# Patient Record
Sex: Male | Born: 1985 | Race: Black or African American | Hispanic: No | Marital: Married | Smoking: Current every day smoker
Health system: Southern US, Community
[De-identification: ages and names within clinical notes are randomized; demographics above are authoritative.]

## PROBLEM LIST (undated history)

## (undated) DIAGNOSIS — R9431 Abnormal electrocardiogram [ECG] [EKG]: Secondary | ICD-10-CM

## (undated) DIAGNOSIS — R079 Chest pain, unspecified: Secondary | ICD-10-CM

## (undated) DIAGNOSIS — J4540 Moderate persistent asthma, uncomplicated: Secondary | ICD-10-CM

## (undated) DIAGNOSIS — F17200 Nicotine dependence, unspecified, uncomplicated: Secondary | ICD-10-CM

## (undated) DIAGNOSIS — J45909 Unspecified asthma, uncomplicated: Secondary | ICD-10-CM

## (undated) DIAGNOSIS — J4520 Mild intermittent asthma, uncomplicated: Secondary | ICD-10-CM

## (undated) DIAGNOSIS — F17210 Nicotine dependence, cigarettes, uncomplicated: Secondary | ICD-10-CM

## (undated) HISTORY — DX: Mild intermittent asthma, uncomplicated: J45.20

## (undated) HISTORY — DX: Nicotine dependence, unspecified, uncomplicated: F17.200

## (undated) HISTORY — DX: Moderate persistent asthma, uncomplicated: J45.40

## (undated) HISTORY — DX: Abnormal electrocardiogram (ECG) (EKG): R94.31

## (undated) HISTORY — DX: Unspecified asthma, uncomplicated: J45.909

## (undated) HISTORY — DX: Chest pain, unspecified: R07.9

## (undated) HISTORY — DX: Nicotine dependence, cigarettes, uncomplicated: F17.210

---

## 2017-09-11 ENCOUNTER — Other Ambulatory Visit: Payer: Self-pay | Admitting: Family Medicine

## 2017-09-11 DIAGNOSIS — K439 Ventral hernia without obstruction or gangrene: Secondary | ICD-10-CM

## 2017-10-05 ENCOUNTER — Ambulatory Visit
Admission: RE | Admit: 2017-10-05 | Discharge: 2017-10-05 | Disposition: A | Discharge status: Home / Self Care | Payer: BLUE CROSS/BLUE SHIELD | Source: Ambulatory Visit | Attending: Family Medicine | Admitting: Family Medicine

## 2017-10-05 DIAGNOSIS — K439 Ventral hernia without obstruction or gangrene: Secondary | ICD-10-CM

## 2019-03-03 IMAGING — US US ABDOMEN LIMITED
1 series · 6 of 6 positions shown · non-contrast
Comparison: None.

CLINICAL DATA: 32-year-old male with right sided abdominal pain for
8 months. Question abdominal wall hernia.

EXAM:
ULTRASOUND ABDOMEN LIMITED

[Series 1: us abdomen limited · 0.17mm/px · 6 of 6 slices shown]
[im 1/6]
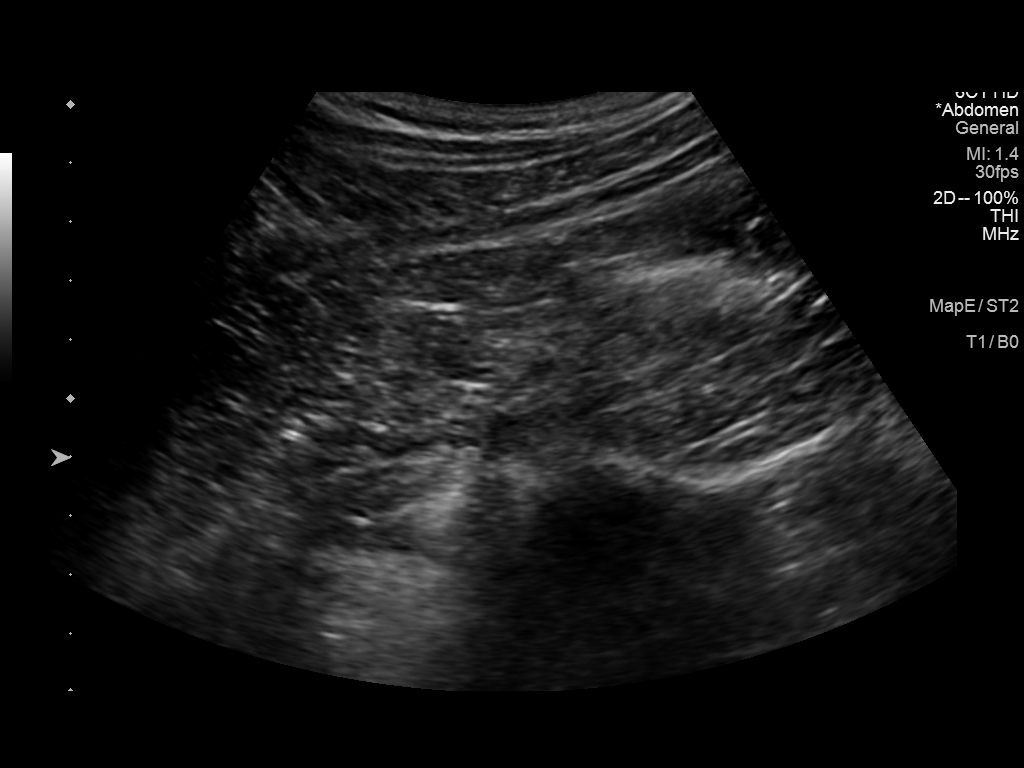
[im 2/6]
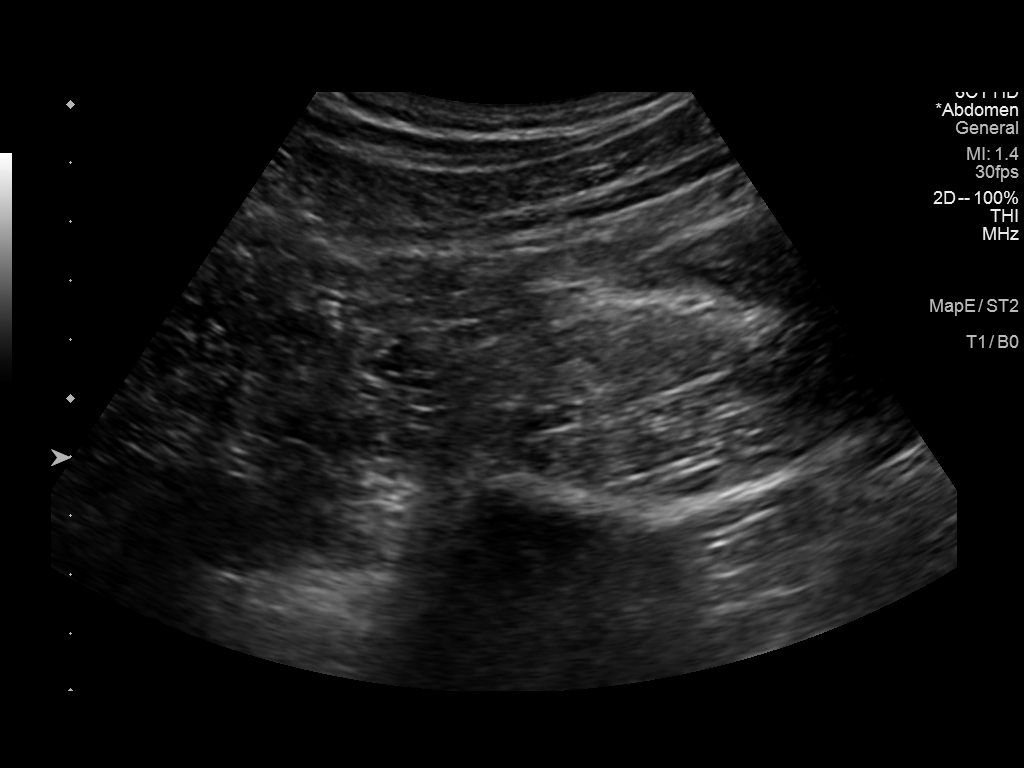
[im 3/6]
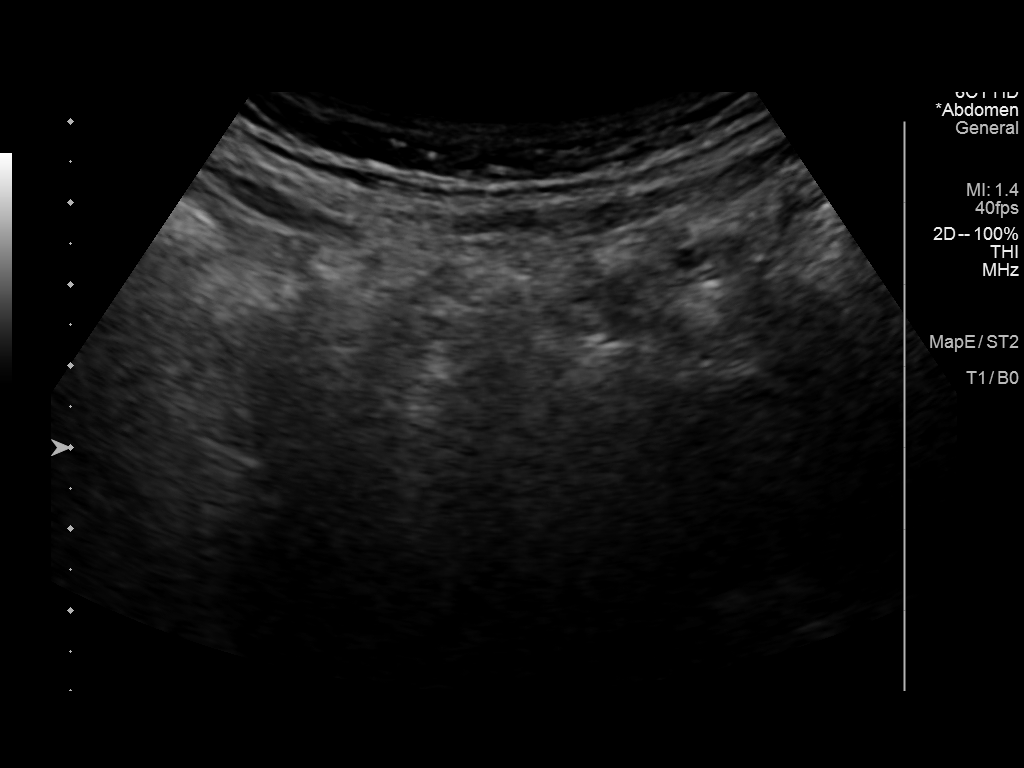
[im 4/6]
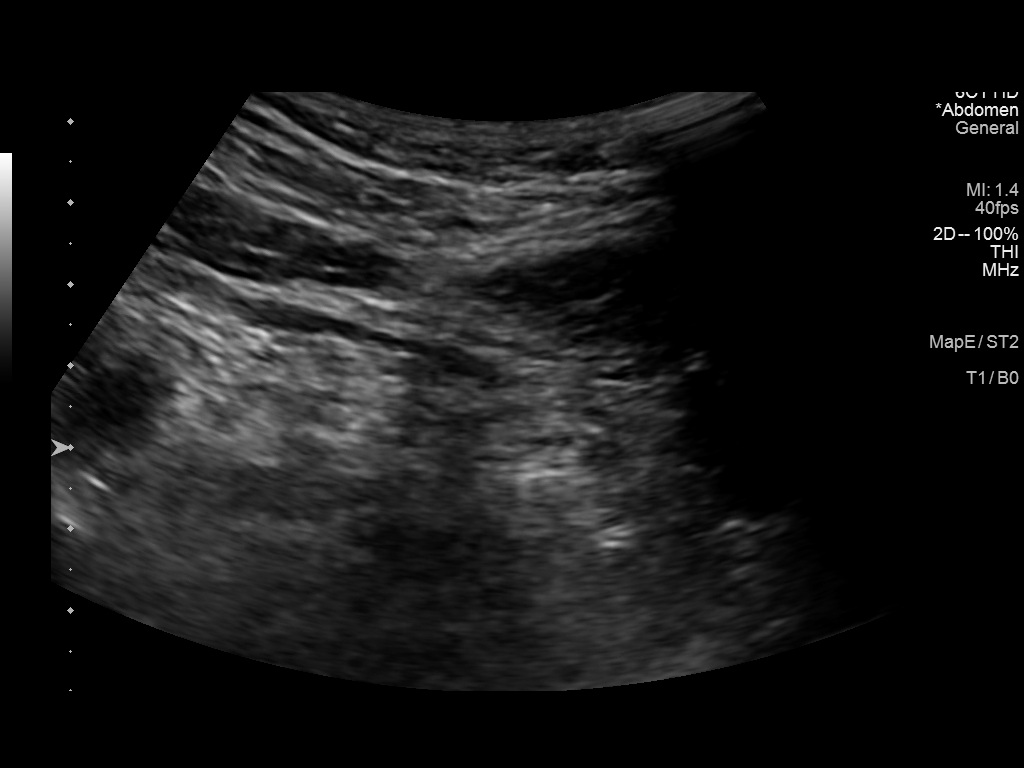
[im 5/6]
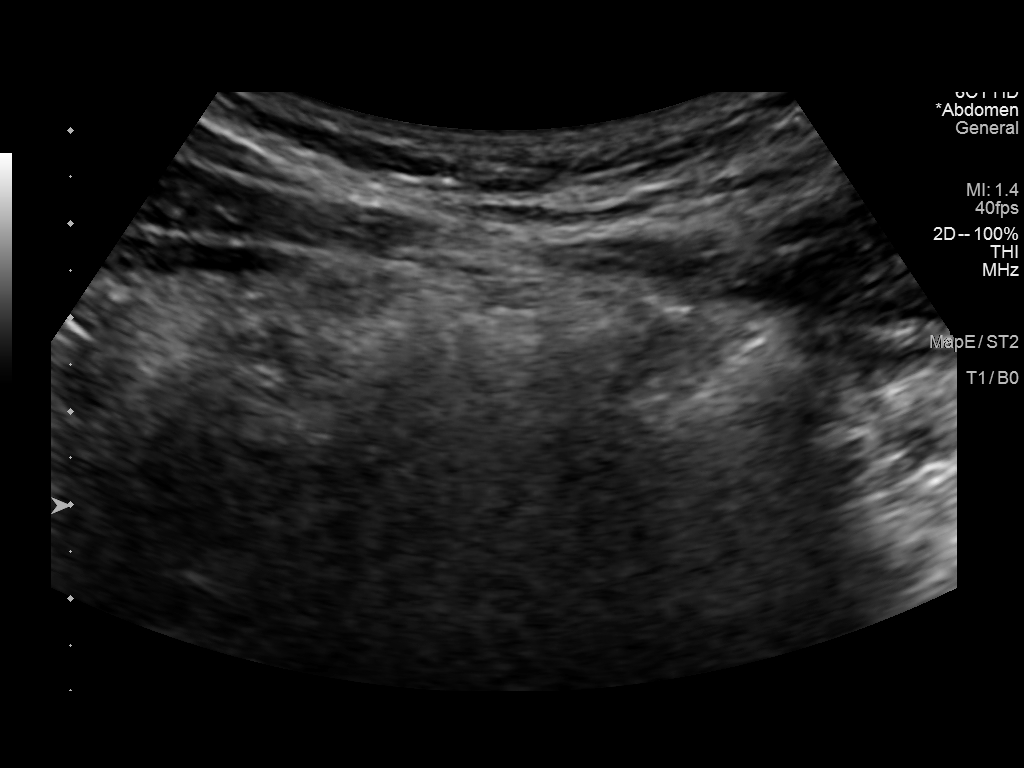
[im 6/6]
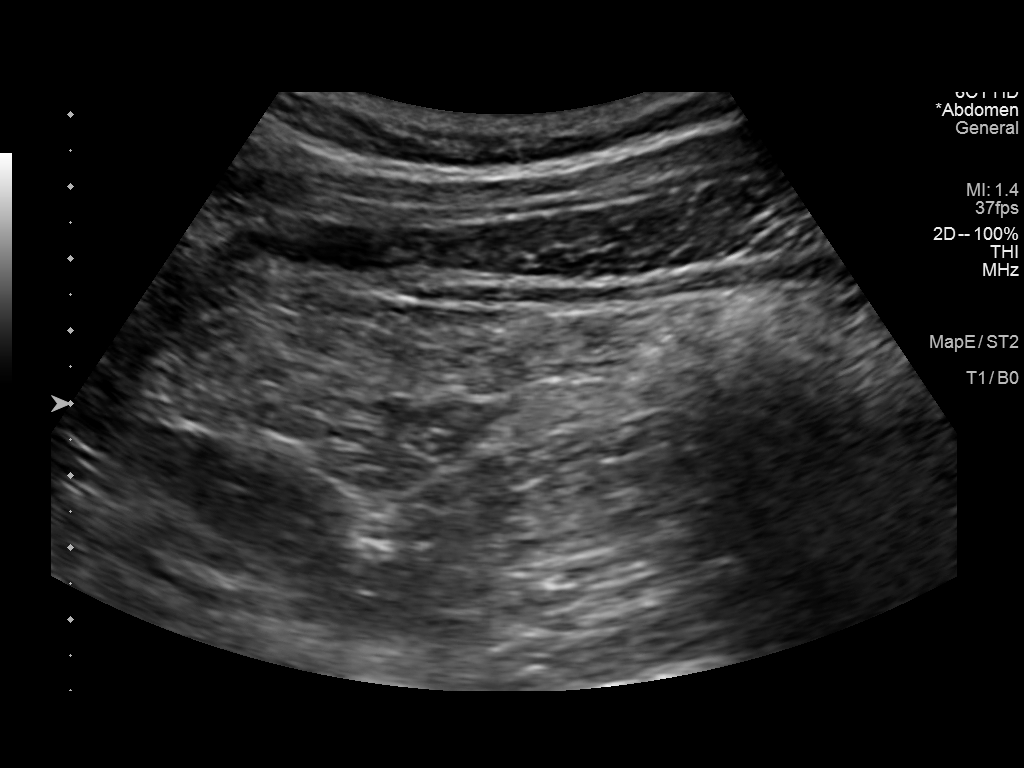

[6 of 6 positions shown; findings below may reference images not displayed]

FINDINGS: Interrogation of the right abdominal wall without evidence of hernia
or mass.
IMPRESSION: No ultrasound evidence of right abdominal wall hernia.

## 2021-02-22 DIAGNOSIS — Y92481 Parking lot as the place of occurrence of the external cause: Secondary | ICD-10-CM | POA: Diagnosis not present

## 2021-02-22 DIAGNOSIS — S99911A Unspecified injury of right ankle, initial encounter: Secondary | ICD-10-CM | POA: Diagnosis not present

## 2021-02-22 DIAGNOSIS — Z7722 Contact with and (suspected) exposure to environmental tobacco smoke (acute) (chronic): Secondary | ICD-10-CM | POA: Diagnosis not present

## 2021-02-22 DIAGNOSIS — S93401A Sprain of unspecified ligament of right ankle, initial encounter: Secondary | ICD-10-CM | POA: Diagnosis not present

## 2021-02-22 DIAGNOSIS — X501XXA Overexertion from prolonged static or awkward postures, initial encounter: Secondary | ICD-10-CM | POA: Diagnosis not present

## 2021-11-20 DIAGNOSIS — A6001 Herpesviral infection of penis: Secondary | ICD-10-CM | POA: Diagnosis not present

## 2021-11-20 DIAGNOSIS — Z113 Encounter for screening for infections with a predominantly sexual mode of transmission: Secondary | ICD-10-CM | POA: Diagnosis not present

## 2021-11-20 DIAGNOSIS — Z114 Encounter for screening for human immunodeficiency virus [HIV]: Secondary | ICD-10-CM | POA: Diagnosis not present

## 2021-11-28 ENCOUNTER — Telehealth: Payer: Self-pay

## 2021-11-28 NOTE — Progress Notes (Signed)
?Cardiology Office Note:   ? ?Date:  11/29/2021  ? ?ID:  Larry Fisher, DOB July 09, 1986, MRN [id number] ? ?PCP:  Filomena Jungling, NP  ? ?[facility] ?Cardiologist:  Alverda Skeans, MD ?Referring MD: Moshe Cipro, NP  ? ?Chief Complaint/Reason for Referral: Chest pain ? ?ASSESSMENT:   ? ?1. Precordial pain   ?2. Asthma, unspecified asthma severity, unspecified whether complicated, unspecified whether persistent   ?3. Tobacco abuse   ? ? ?PLAN:   ? ?In order of problems listed above: ?1.  Chest pain:  We will obtain a coronary CTA and echocardiogram to evaluate further.  If the patient has mild obstructive coronary artery disease, they will require a statin (with goal LDL < 70) and aspirin, if they have high-grade disease we will need to consider optimal medical therapy and if symptoms are refractory to medical therapy, then a cardiac catheterization with possible PCI will be pursued to alleviate symptoms.  If they have high risk disease we will proceed directly to cardiac catheterization.  I will keep follow-up open-ended depending on these results. ?2.  Asthma: This is being managed by other providers. ?3.  Tobacco abuse: I counseled the patient on the need to abstain from tobacco. ? ?     ? ?   ? ?Dispo:  Return if symptoms worsen or fail to improve.  ? ?  ? ?Medication Adjustments/Labs and Tests Ordered: ?Current medicines are reviewed at length with the patient today.  Concerns regarding medicines are outlined above. ? ?The following changes have been made:    ? ?Labs/tests ordered: ?Orders Placed This Encounter  ?Procedures  ? CT CORONARY MORPH W/CTA COR W/SCORE W/CA W/CM &/OR WO/CM  ? EKG 12-Lead  ? ECHOCARDIOGRAM COMPLETE  ? ? ?Medication Changes: ?Meds ordered this encounter  ?Medications  ? metoprolol tartrate (LOPRESSOR) 100 MG tablet  ?  Sig: Take 1 tablet (100 mg total) by mouth once for 1 dose. Take 90-120 minutes prior to scan.  ?  Dispense:  1 tablet  ?  Refill:  0  ? ? ? ?Current  medicines are reviewed at length with the patient today.  The patient does not have concerns regarding medicines. ? ? ?History of Present Illness:   ? ?FOCUSED PROBLEM LIST:   ?1.  Asthma ?2.  Tobacco abuse ? ?The patient is a 36 y.o. male with the indicated medical history here for recommendations regarding chest pain.  Patient tells me that he has developed chest pain on occasion.  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  Does not seem to occur reliably with exertion but mostly with rest.  On occasion it has happened with exertion.  He denies any shortness of breath.  He does smoke on a regular basis and is planning on quitting.  Denies any other significant cardiovascular symptomatology including palpitations, paroxysmal nocturnal dyspnea, orthopnea.  He has not required emergency room visits or hospitalizations. ? ?Current Medications: ?Current Meds  ?Medication Sig  ? albuterol (VENTOLIN HFA) 108 (90 Base) MCG/ACT inhaler Inhale into the lungs every 6 (six) hours as needed for wheezing or shortness of breath.  ? Albuterol Sulfate 2.5 MG/0.5ML NEBU Inhale into the lungs.  ? metoprolol tartrate (LOPRESSOR) 100 MG tablet Take 1 tablet (100 mg total) by mouth once for 1 dose. Take 90-120 minutes prior to scan.  ?  ? ?Allergies:    ?Patient has no known  allergies.  ? ?Social History:   ?Social History  ? ?Tobacco Use  ? Smoking status: Every Day  ?  Types: Cigarettes  ? Smokeless tobacco: Never  ?  ? ?Family Hx: ?Family History  ?Problem Relation Age of Onset  ? Cerebral aneurysm Mother   ? Asthma Father   ?  ? ?Review of Systems:   ?Please see the history of present illness.    ?All other systems reviewed and are negative. ?  ? ? ?EKGs/Labs/Other Test Reviewed:   ? ?EKG:  EKG performed Nov 27, 2021 that I personally reviewed demonstrates sinus rhythm; EKG today demonstrates normal sinus rhythm ? ?Prior CV  studies: ?None available ? ?Other studies Reviewed: ?Review of the additional studies/records demonstrates: Ultrasound abdomen without aortic pathology 2019 ? ?Recent Labs: ?No results found for requested labs within last 8760 hours.  ? ?Recent Lipid Panel ?No results found for: CHOL, TRIG, HDL, LDLCALC, LDLDIRECT ? ?Risk Assessment/Calculations:   ? ?  ?    ? ?Physical Exam:   ? ?VS:  BP 128/76   Pulse 81   Ht 5\' 8"  (1.727 m)   Wt 153 lb (69.4 kg)   SpO2 97%   BMI 23.26 kg/m?    ?Wt Readings from Last 3 Encounters:  ?11/29/21 153 lb (69.4 kg)  ?  ?GENERAL:  No apparent distress, AOx3 ?HEENT:  No carotid bruits, +2 carotid impulses, no scleral icterus ?CAR: RRR  no murmurs, gallops, rubs, or thrills ?RES:  Clear to auscultation bilaterally ?ABD:  Soft, nontender, nondistended, positive bowel sounds x 4 ?VASC:  +2 radial pulses, +2 carotid pulses, palpable pedal pulses ?NEURO:  CN 2-12 grossly intact; motor and sensory grossly intact ?PSYCH:  No active depression or anxiety ?EXT:  No edema, ecchymosis, or cyanosis ? ?Signed, ?01/29/22, MD  ?11/29/2021 5:16 PM    ?[facility] ?[address] [place], [place], [place]  [place] ?Phone: [phone]; Fax: [phone]  ? ?Note:  This document was prepared using Dragon voice recognition software and may include unintentional dictation errors. ?

## 2021-11-28 NOTE — Telephone Encounter (Signed)
NOTES SCANNED TO REFERRAL 

## 2021-11-29 ENCOUNTER — Ambulatory Visit (INDEPENDENT_AMBULATORY_CARE_PROVIDER_SITE_OTHER): Payer: BC Managed Care – PPO | Admitting: Internal Medicine

## 2021-11-29 ENCOUNTER — Encounter: Payer: Self-pay | Admitting: Internal Medicine

## 2021-11-29 VITALS — BP 128/76 | HR 81 | Ht 68.0 in | Wt 153.0 lb

## 2021-11-29 DIAGNOSIS — Z720 Tobacco use: Secondary | ICD-10-CM | POA: Diagnosis not present

## 2021-11-29 DIAGNOSIS — R072 Precordial pain: Secondary | ICD-10-CM

## 2021-11-29 DIAGNOSIS — J45909 Unspecified asthma, uncomplicated: Secondary | ICD-10-CM

## 2021-11-29 MED ORDER — METOPROLOL TARTRATE 100 MG PO TABS: 100.0000 mg | ORAL_TABLET | Freq: Once | ORAL | 0 refills | Status: DC

## 2021-11-29 NOTE — Patient Instructions (Signed)
Medication Instructions:  ?No changes ?*If you need a refill on your cardiac medications before your next appointment, please call your pharmacy* ? ? ?Lab Work: ?none ?If you have labs (blood work) drawn today and your tests are completely normal, you will receive your results only by: ?MyChart Message (if you have MyChart) OR ?A paper copy in the mail ?If you have any lab test that is abnormal or we need to change your treatment, we will call you to review the results. ? ? ?Testing/Procedures: ?Your physician has requested that you have an echocardiogram. Echocardiography is a painless test that uses sound waves to create images of your heart. It provides your doctor with information about the size and shape of your heart and how well your heart?s chambers and valves are working. This procedure takes approximately one hour. There are no restrictions for this procedure. ? ?Cardiac CTA - see instructions below. ? ? ?Follow-Up: ?As needed ? ? ?Other Instructions ? ? ?Your cardiac CT will be scheduled at  ?[facility] ?[address] ?[place], [place] [address] ?[phone] ? ?Please arrive at the [facility] and Children[facility] Entrance (Entrance C2) of [facility] 30 minutes prior to test start time. ?You can use the FREE valet parking offered at entrance C (encouraged to control the heart rate for the test)  ?Proceed to the [facility] Radiology Department (first floor) to check-in and test prep. ? ?All radiology patients and guests should use entrance C2 at [facility], accessed from [facility], even though the hospital's physical address listed is [address] ? ? ? ?If scheduled at [facility], please arrive 15 mins early for check-in and test prep. ? ?Please follow these instructions carefully (unless otherwise directed): ? ?Hold all erectile dysfunction medications at least 3 days (72 hrs) prior to test. ? ?On the Night Before the Test: ?Be  sure to Drink plenty of water. ?Do not consume any caffeinated/decaffeinated beverages or chocolate 12 hours prior to your test. ?Do not take any antihistamines 12 hours prior to your test. ? ?On the Day of the Test: ?Drink plenty of water until 1 hour prior to the test. ?Do not eat any food 4 hours prior to the test. ?You may take your regular medications prior to the test.  ?Take metoprolol (Lopressor) two hours prior to test. ? ?After the Test: ?Drink plenty of water. ?After receiving IV contrast, you may experience a mild flushed feeling. This is normal. ?On occasion, you may experience a mild rash up to 24 hours after the test. This is not dangerous. If this occurs, you can take Benadryl 25 mg and increase your fluid intake. ?If you experience trouble breathing, this can be serious. If it is severe call 911 IMMEDIATELY. If it is mild, please call our office. ?If you take any of these medications: Glipizide/Metformin, Avandament, Glucavance, please do not take 48 hours after completing test unless otherwise instructed. ? ?We will call to schedule your test 2-4 weeks out understanding that some insurance companies will need an authorization prior to the service being performed.  ? ?For non-scheduling related questions, please contact the cardiac imaging nurse navigator should you have any questions/concerns: ?Rockwell Alexandria, Cardiac Imaging Nurse Navigator ?Larey Brick, Cardiac Imaging Nurse Navigator ?[facility] ?Direct Office Dial: [phone]  ? ?For scheduling needs, including cancellations and rescheduling, please call [place], [phone]. ? ? ?Important Information About Sugar ? ? ? ? ?  ?

## 2021-12-18 ENCOUNTER — Ambulatory Visit (HOSPITAL_COMMUNITY): Payer: BC Managed Care – PPO | Attending: Cardiology

## 2021-12-18 ENCOUNTER — Telehealth (HOSPITAL_COMMUNITY): Payer: Self-pay | Admitting: Emergency Medicine

## 2021-12-18 NOTE — Telephone Encounter (Signed)
Reaching out to patient to offer assistance regarding upcoming cardiac imaging study; pt verbalizes understanding of appt date/time, parking situation and where to check in, pre-test NPO status and medications ordered, and verified current allergies; name and call back number provided for further questions should they arise ?Ege Muckey RN Navigator Cardiac Imaging ?[facility] ?[phone] office ?[phone] cell ? ?Denies iv issues ?100mg metoprolol tartrate  ?Arrival 400 ?

## 2021-12-19 ENCOUNTER — Other Ambulatory Visit (HOSPITAL_COMMUNITY): Payer: Self-pay | Admitting: *Deleted

## 2021-12-19 ENCOUNTER — Telehealth (HOSPITAL_COMMUNITY): Payer: Self-pay | Admitting: *Deleted

## 2021-12-19 MED ORDER — METOPROLOL TARTRATE 100 MG PO TABS: 100.0000 mg | ORAL_TABLET | Freq: Once | ORAL | 0 refills | Status: AC

## 2021-12-19 NOTE — Telephone Encounter (Signed)
Patient calling to request another metoprolol for his CCTA since he took the first pill on accident.  Sent another metoprolol tablet in.  Larey Brick RN Navigator Cardiac Imaging [facility] [phone] Office [phone] Cell

## 2021-12-20 ENCOUNTER — Ambulatory Visit (HOSPITAL_COMMUNITY)
Admission: RE | Admit: 2021-12-20 | Discharge: 2021-12-20 | Disposition: A | Discharge status: Home / Self Care | Payer: BC Managed Care – PPO | Source: Ambulatory Visit | Attending: Internal Medicine | Admitting: Internal Medicine

## 2021-12-20 DIAGNOSIS — R072 Precordial pain: Secondary | ICD-10-CM | POA: Diagnosis not present

## 2021-12-20 MED ORDER — NITROGLYCERIN 0.4 MG SL SUBL
0.8000 mg | SUBLINGUAL_TABLET | Freq: Once | SUBLINGUAL | Status: AC
  Administered 2021-12-20: 0.8 mg via SUBLINGUAL

## 2021-12-20 MED ORDER — IOHEXOL 350 MG/ML SOLN
100.0000 mL | Freq: Once | INTRAVENOUS | Status: AC | PRN
  Administered 2021-12-20: 100 mL via INTRAVENOUS

## 2021-12-20 MED ORDER — NITROGLYCERIN 0.4 MG SL SUBL
SUBLINGUAL_TABLET | SUBLINGUAL | Status: AC
  Filled 2021-12-20: qty 2

## 2021-12-20 MED ORDER — SODIUM CHLORIDE 0.9 % IV BOLUS
250.0000 mL | Freq: Once | INTRAVENOUS | Status: AC
  Administered 2021-12-20: 250 mL via INTRAVENOUS

## 2022-01-01 ENCOUNTER — Telehealth: Payer: Self-pay | Admitting: Internal Medicine

## 2022-01-01 NOTE — Telephone Encounter (Signed)
Pt c/o medication issue:  1. Name of Medication:   metoprolol tartrate (LOPRESSOR) 100 MG tablet (Expired)    2. How are you currently taking this medication (dosage and times per day)?  Take 1 tablet (100 mg total) by mouth once for 1 dose. Take 90-120 minutes prior to scan.      3. Are you having a reaction (difficulty breathing--STAT)? No  4. What is your medication issue?  Pt states that he is not sure whether to take medication before is ECHO appt or not. Please advise

## 2022-01-01 NOTE — Telephone Encounter (Signed)
Called patient and informed the metoprolol was for the CT scan only and that there are no restrictions for the Echo.  Pt voices understanding.

## 2022-01-03 ENCOUNTER — Ambulatory Visit (HOSPITAL_COMMUNITY): Payer: BC Managed Care – PPO | Attending: Cardiology

## 2022-01-03 DIAGNOSIS — R072 Precordial pain: Secondary | ICD-10-CM | POA: Insufficient documentation

## 2022-01-03 LAB — ECHOCARDIOGRAM COMPLETE
Area-P 1/2: 4.17 cm2
S' Lateral: 2.6 cm

## 2023-05-18 IMAGING — CT CT HEART MORP W/ CTA COR W/ SCORE W/ CA W/CM &/OR W/O CM
4 of 7 series · 8 of 20 positions shown, 9 images · IV contrast (APPLIED)
Comparison: None.

Addendum:
CLINICAL DATA: Chest pain

EXAM:
Cardiac CTA
MEDICATIONS:
Sub lingual nitro. 4mg and lopressor 100mg
TECHNIQUE: The patient was scanned on a Siemens Force [REDACTED]ice scanner. Gantry
rotation speed was 250 msecs. Collimation was .6 mm. A 100 kV
prospective scan was triggered in the ascending thoracic aorta at
140 HU's Full mA was used between 35% and 75% of the R-R interval.
Average HR during the scan was 52 bpm. The 3D data set was
interpreted on a dedicated work station using MPR, MIP and VRT
modes. A total of 80 cc of contrast was used.

[Series 7: ts diast sharp · axial · 0.30mm/px · z∈[-179,-144]mm · 2 of 261 slices shown]
[im 87/261  lung]
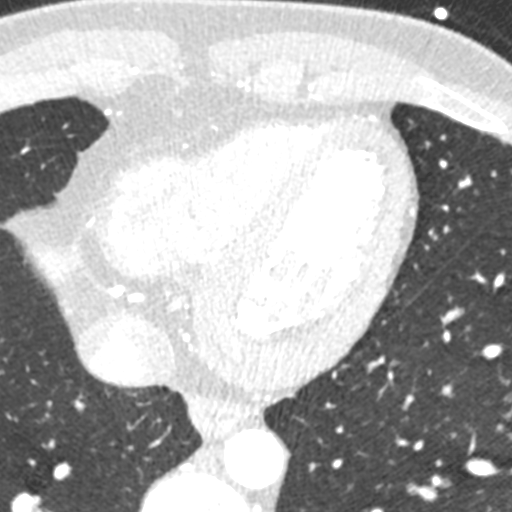
[im 174/261  lung]
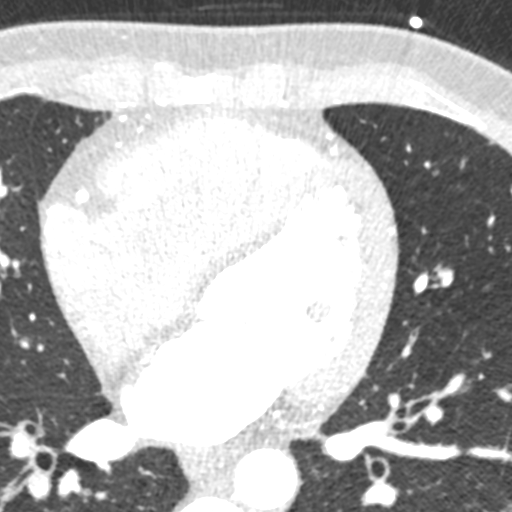

[Series 8: ts syst sharp · axial · 0.30mm/px · z∈[-179,-144]mm · 2 of 261 slices shown]
[im 87/261  lung]
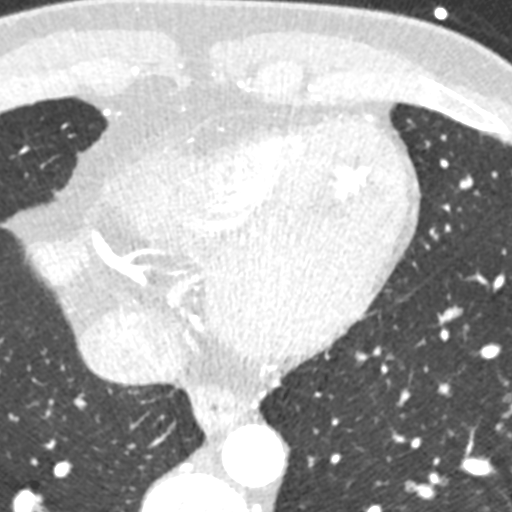
[im 174/261  lung]
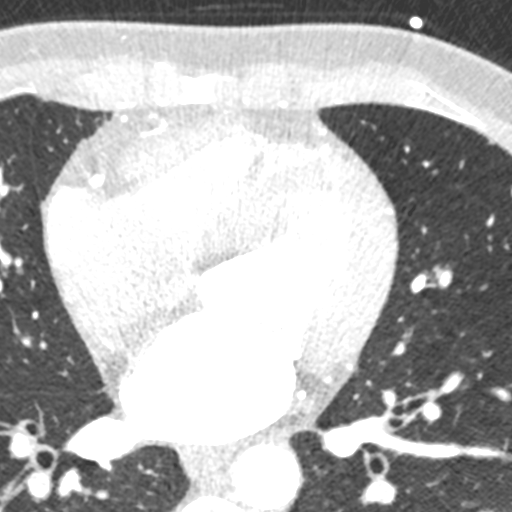

[Series 9: best syst · axial · 0.30mm/px · z∈[-179,-144]mm · 2 of 261 slices shown, 3 images]
[im 87/261  vessel]
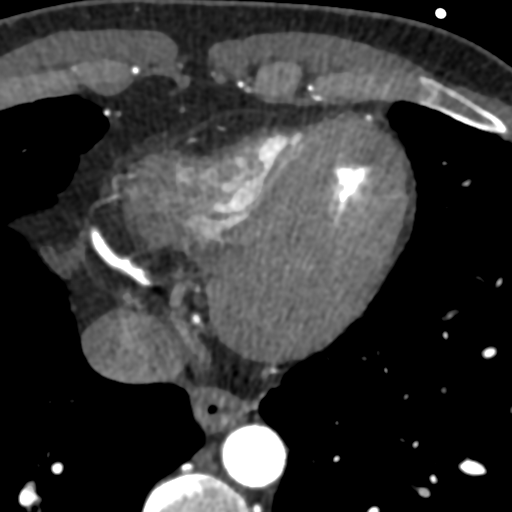
[im 87/261  lung]
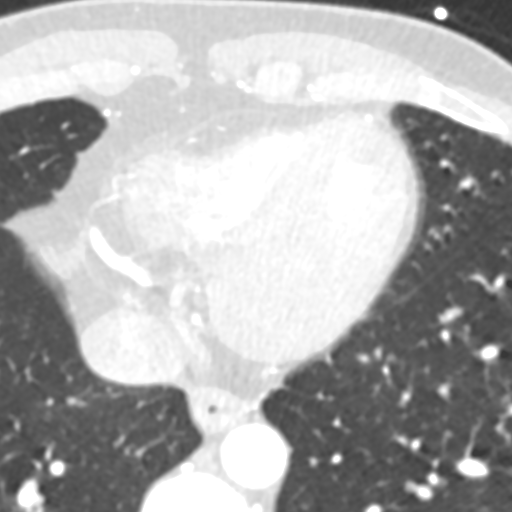
[im 174/261  vessel]
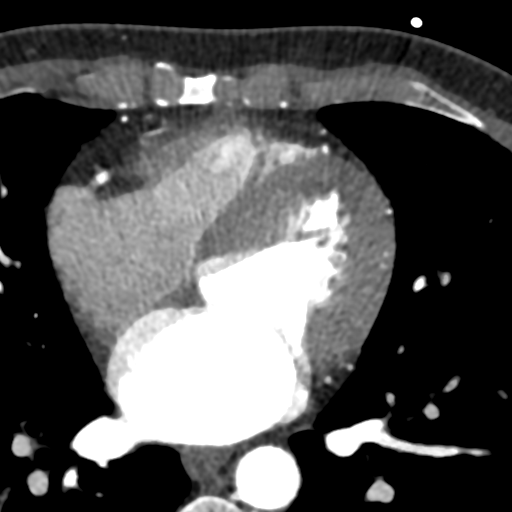

[Series 10: best diast · axial · 0.30mm/px · z∈[-179,-144]mm · 2 of 261 slices shown]
[im 87/261  vessel]
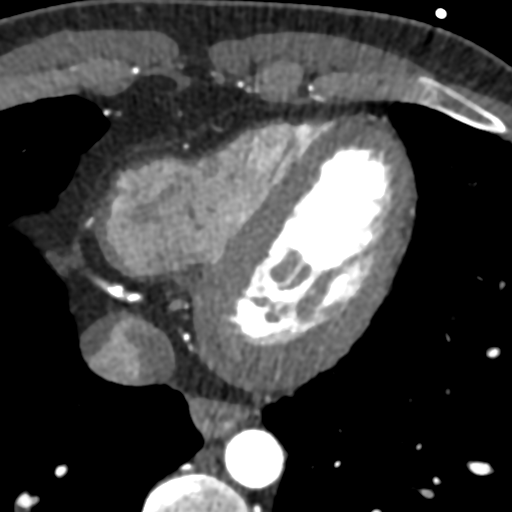
[im 174/261  vessel]
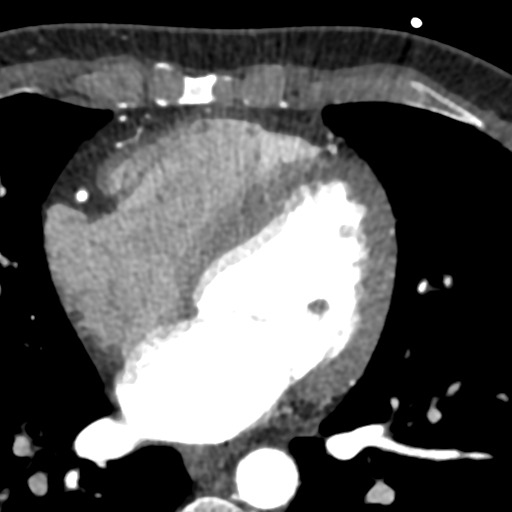

[8 of 20 positions shown; findings below may reference images not displayed]

FINDINGS: Non-cardiac: See separate report from [REDACTED]. No
significant findings on limited lung and soft tissue windows.

Calcium Score: No calcium noted

Coronary Arteries: Right dominant with no anomalies

LM: Normal

LAD: Normal

IM: Small normal

D1: Normal

D2: Normal

Circumflex: Normal

OM1: Normal

OM2: Normal

RCA: Normal

PDA: Normal

PLA: Normal
IMPRESSION: 1.  Calcium score 0

2.  Normal right dominant coronary arteries with no anomaly

3.  Normal ascending thoracic aorta 2.8 cm

4.  Cannot r/o small PFO

Nazareth Jumper

EXAM:
OVER-READ INTERPRETATION  CT CHEST

The following report is an over-read performed by radiologist Dr.
over-read does not include interpretation of cardiac or coronary
anatomy or pathology. The interpretation by the cardiologist is
attached.
FINDINGS: 3 mm peripheral left lower lobe nodule ([DATE]). No additional acute
or suspicious extracardiac findings.
IMPRESSION: 3 mm peripheral left lower lobe nodule. No follow-up needed if
patient is low-risk.This recommendation follows the consensus
statement: Guidelines for Management of Incidental Pulmonary Nodules
Detected on CT Images: From the [HOSPITAL] 9761; Radiology
9761; [DATE].

*** End of Addendum ***
FINDINGS: Non-cardiac: See separate report from [REDACTED]. No
significant findings on limited lung and soft tissue windows.

Calcium Score: No calcium noted

Coronary Arteries: Right dominant with no anomalies

LM: Normal

LAD: Normal

IM: Small normal

D1: Normal

D2: Normal

Circumflex: Normal

OM1: Normal

OM2: Normal

RCA: Normal

PDA: Normal

PLA: Normal
IMPRESSION: 1.  Calcium score 0

2.  Normal right dominant coronary arteries with no anomaly

3.  Normal ascending thoracic aorta 2.8 cm

4.  Cannot r/o small PFO

Nazareth Jumper
